# Patient Record
Sex: Female | Born: 1990 | Race: Black or African American | Hispanic: No | Marital: Single | Smoking: Never smoker
Health system: Southern US, Community
[De-identification: ages and names within clinical notes are randomized; demographics above are authoritative.]

## PROBLEM LIST (undated history)

## (undated) DIAGNOSIS — S0990XA Unspecified injury of head, initial encounter: Secondary | ICD-10-CM

---
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
## 2011-02-09 ENCOUNTER — Inpatient Hospital Stay (INDEPENDENT_AMBULATORY_CARE_PROVIDER_SITE_OTHER)
Admission: RE | Admit: 2011-02-09 | Discharge: 2011-02-09 | Disposition: A | Discharge status: Home / Self Care | Payer: Medicaid Other | Source: Ambulatory Visit | Attending: Family Medicine | Admitting: Family Medicine

## 2011-02-09 DIAGNOSIS — B356 Tinea cruris: Secondary | ICD-10-CM

## 2011-02-09 DIAGNOSIS — L259 Unspecified contact dermatitis, unspecified cause: Secondary | ICD-10-CM

## 2011-03-12 ENCOUNTER — Emergency Department (HOSPITAL_COMMUNITY): Payer: Medicaid Other

## 2011-03-12 ENCOUNTER — Emergency Department (HOSPITAL_COMMUNITY)
Admission: EM | Admit: 2011-03-12 | Discharge: 2011-03-12 | Disposition: A | Discharge status: Home / Self Care | Payer: Medicaid Other | Attending: Emergency Medicine | Admitting: Emergency Medicine

## 2011-03-12 DIAGNOSIS — F410 Panic disorder [episodic paroxysmal anxiety] without agoraphobia: Secondary | ICD-10-CM | POA: Insufficient documentation

## 2011-03-12 DIAGNOSIS — R51 Headache: Secondary | ICD-10-CM | POA: Insufficient documentation

## 2011-03-12 DIAGNOSIS — R0602 Shortness of breath: Secondary | ICD-10-CM | POA: Insufficient documentation

## 2011-03-12 DIAGNOSIS — R079 Chest pain, unspecified: Secondary | ICD-10-CM | POA: Insufficient documentation

## 2015-06-06 ENCOUNTER — Emergency Department (HOSPITAL_COMMUNITY)
Admission: EM | Admit: 2015-06-06 | Discharge: 2015-06-06 | Disposition: A | Discharge status: Home / Self Care | Payer: Medicaid Other | Attending: Emergency Medicine | Admitting: Emergency Medicine

## 2015-06-06 ENCOUNTER — Emergency Department (HOSPITAL_COMMUNITY): Payer: Medicaid Other

## 2015-06-06 ENCOUNTER — Encounter (HOSPITAL_COMMUNITY): Payer: Self-pay

## 2015-06-06 DIAGNOSIS — Y9389 Activity, other specified: Secondary | ICD-10-CM | POA: Insufficient documentation

## 2015-06-06 DIAGNOSIS — S0990XA Unspecified injury of head, initial encounter: Secondary | ICD-10-CM

## 2015-06-06 DIAGNOSIS — S00212A Abrasion of left eyelid and periocular area, initial encounter: Secondary | ICD-10-CM | POA: Insufficient documentation

## 2015-06-06 DIAGNOSIS — Z792 Long term (current) use of antibiotics: Secondary | ICD-10-CM | POA: Insufficient documentation

## 2015-06-06 DIAGNOSIS — Y998 Other external cause status: Secondary | ICD-10-CM | POA: Insufficient documentation

## 2015-06-06 DIAGNOSIS — Y9289 Other specified places as the place of occurrence of the external cause: Secondary | ICD-10-CM | POA: Insufficient documentation

## 2015-06-06 DIAGNOSIS — S80211A Abrasion, right knee, initial encounter: Secondary | ICD-10-CM | POA: Insufficient documentation

## 2015-06-06 DIAGNOSIS — Z79899 Other long term (current) drug therapy: Secondary | ICD-10-CM | POA: Insufficient documentation

## 2015-06-06 DIAGNOSIS — S0012XA Contusion of left eyelid and periocular area, initial encounter: Secondary | ICD-10-CM | POA: Insufficient documentation

## 2015-06-06 DIAGNOSIS — S0083XA Contusion of other part of head, initial encounter: Secondary | ICD-10-CM | POA: Insufficient documentation

## 2015-06-06 DIAGNOSIS — S29001A Unspecified injury of muscle and tendon of front wall of thorax, initial encounter: Secondary | ICD-10-CM | POA: Insufficient documentation

## 2015-06-06 HISTORY — DX: Unspecified injury of head, initial encounter: S09.90XA

## 2015-06-06 LAB — I-STAT BETA HCG BLOOD, ED (MC, WL, AP ONLY)

## 2015-06-06 MED ORDER — ONDANSETRON 4 MG PO TBDP
4.0000 mg | ORAL_TABLET | Freq: Once | ORAL | Status: AC
  Administered 2015-06-06: 4 mg via ORAL
  Filled 2015-06-06: qty 1

## 2015-06-06 MED ORDER — HYDROCODONE-ACETAMINOPHEN 5-325 MG PO TABS
1.0000 | ORAL_TABLET | Freq: Once | ORAL | Status: AC
  Administered 2015-06-06: 1 via ORAL
  Filled 2015-06-06: qty 1

## 2015-06-06 MED ORDER — ONDANSETRON 4 MG PO TBDP: 4.0000 mg | ORAL_TABLET | Freq: Three times a day (TID) | ORAL | Status: AC | PRN

## 2015-06-06 MED ORDER — HYDROCODONE-ACETAMINOPHEN 5-325 MG PO TABS: 2.0000 | ORAL_TABLET | ORAL | Status: AC | PRN
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
## 2015-06-06 NOTE — Discharge Instructions (Signed)
Concussion, Adult A concussion, or closed-head injury, is a brain injury caused by a direct blow to the head or by a quick and sudden movement (jolt) of the head or neck. Concussions are usually not life-threatening. Even so, the effects of a concussion can be serious. If you have had a concussion before, you are more likely to experience concussion-like symptoms after a direct blow to the head.  CAUSES  Direct blow to the head, such as from running into another player during a soccer game, being hit in a fight, or hitting your head on a hard surface.  A jolt of the head or neck that causes the brain to move back and forth inside the skull, such as in a car crash. SIGNS AND SYMPTOMS The signs of a concussion can be hard to notice. Early on, they may be missed by you, family members, and health care providers. You may look fine but act or feel differently. Symptoms are usually temporary, but they may last for days, weeks, or even longer. Some symptoms may appear right away while others may not show up for hours or days. Every head injury is different. Symptoms include:  Mild to moderate headaches that will not go away.  A feeling of pressure inside your head.  Having more trouble than usual:  Learning or remembering things you have heard.  Answering questions.  Paying attention or concentrating.  Organizing daily tasks.  Making decisions and solving problems.  Slowness in thinking, acting or reacting, speaking, or reading.  Getting lost or being easily confused.  Feeling tired all the time or lacking energy (fatigued).  Feeling drowsy.  Sleep disturbances.  Sleeping more than usual.  Sleeping less than usual.  Trouble falling asleep.  Trouble sleeping (insomnia).  Loss of balance or feeling lightheaded or dizzy.  Nausea or vomiting.  Numbness or tingling.  Increased sensitivity to:  Sounds.  Lights.  Distractions.  Vision problems or eyes that tire  easily.  Diminished sense of taste or smell.  Ringing in the ears.  Mood changes such as feeling sad or anxious.  Becoming easily irritated or angry for little or no reason.  Lack of motivation.  Seeing or hearing things other people do not see or hear (hallucinations). DIAGNOSIS Your health care provider can usually diagnose a concussion based on a description of your injury and symptoms. He or she will ask whether you passed out (lost consciousness) and whether you are having trouble remembering events that happened right before and during your injury. Your evaluation might include:  A brain scan to look for signs of injury to the brain. Even if the test shows no injury, you may still have a concussion.  Blood tests to be sure other problems are not present. TREATMENT  Concussions are usually treated in an emergency department, in urgent care, or at a clinic. You may need to stay in the hospital overnight for further treatment.  Tell your health care provider if you are taking any medicines, including prescription medicines, over-the-counter medicines, and natural remedies. Some medicines, such as blood thinners (anticoagulants) and aspirin, may increase the chance of complications. Also tell your health care provider whether you have had alcohol or are taking illegal drugs. This information may affect treatment.  Your health care provider will send you home with important instructions to follow.  How fast you will recover from a concussion depends on many factors. These factors include how severe your concussion is, what part of your brain was injured,  your age, and how healthy you were before the concussion. °· Most people with mild injuries recover fully. Recovery can take time. In general, recovery is slower in older persons. Also, persons who have had a concussion in the past or have other medical problems may find that it takes longer to recover from their current injury. °HOME  CARE INSTRUCTIONS °General Instructions °· Carefully follow the directions your health care provider gave you. °· Only take over-the-counter or prescription medicines for pain, discomfort, or fever as directed by your health care provider. °· Take only those medicines that your health care provider has approved. °· Do not drink alcohol until your health care provider says you are well enough to do so. Alcohol and certain other drugs may slow your recovery and can put you at risk of further injury. °· If it is harder than usual to remember things, write them down. °· If you are easily distracted, try to do one thing at a time. For example, do not try to watch TV while fixing dinner. °· Talk with family members or close friends when making important decisions. °· Keep all follow-up appointments. Repeated evaluation of your symptoms is recommended for your recovery. °· Watch your symptoms and tell others to do the same. Complications sometimes occur after a concussion. Older adults with a brain injury may have a higher risk of serious complications, such as a blood clot on the brain. °· Tell your teachers, school nurse, school counselor, coach, athletic trainer, or work manager about your injury, symptoms, and restrictions. Tell them about what you can or cannot do. They should watch for: °¨ Increased problems with attention or concentration. °¨ Increased difficulty remembering or learning new information. °¨ Increased time needed to complete tasks or assignments. °¨ Increased irritability or decreased ability to cope with stress. °¨ Increased symptoms. °· Rest. Rest helps the brain to heal. Make sure you: °¨ Get plenty of sleep at night. Avoid staying up late at night. °¨ Keep the same bedtime hours on weekends and weekdays. °¨ Rest during the day. Take daytime naps or rest breaks when you feel tired. °· Limit activities that require a lot of thought or concentration. These include: °¨ Doing homework or job-related  work. °¨ Watching TV. °¨ Working on the computer. °· Avoid any situation where there is potential for another head injury (football, hockey, soccer, basketball, martial arts, downhill snow sports and horseback riding). Your condition will get worse every time you experience a concussion. You should avoid these activities until you are evaluated by the appropriate follow-up health care providers. °Returning To Your Regular Activities °You will need to return to your normal activities slowly, not all at once. You must give your body and brain enough time for recovery. °· Do not return to sports or other athletic activities until your health care provider tells you it is safe to do so. °· Ask your health care provider when you can drive, ride a bicycle, or operate heavy machinery. Your ability to react may be slower after a brain injury. Never do these activities if you are dizzy. °· Ask your health care provider about when you can return to work or school. °Preventing Another Concussion °It is very important to avoid another brain injury, especially before you have recovered. In rare cases, another injury can lead to permanent brain damage, brain swelling, or death. The risk of this is greatest during the first 7-10 days after a head injury. Avoid injuries by: °· Wearing a   seat belt when riding in a car.  Drinking alcohol only in moderation.  Wearing a helmet when biking, skiing, skateboarding, skating, or doing similar activities.  Avoiding activities that could lead to a second concussion, such as contact or recreational sports, until your health care provider says it is okay.  Taking safety measures in your home.  Remove clutter and tripping hazards from floors and stairways.  Use grab bars in bathrooms and handrails by stairs.  Place non-slip mats on floors and in bathtubs.  Improve lighting in dim areas. SEEK MEDICAL CARE IF:  You have increased problems paying attention or  concentrating.  You have increased difficulty remembering or learning new information.  You need more time to complete tasks or assignments than before.  You have increased irritability or decreased ability to cope with stress.  You have more symptoms than before. Seek medical care if you have any of the following symptoms for more than 2 weeks after your injury:  Lasting (chronic) headaches.  Dizziness or balance problems.  Nausea.  Vision problems.  Increased sensitivity to noise or light.  Depression or mood swings.  Anxiety or irritability.  Memory problems.  Difficulty concentrating or paying attention.  Sleep problems.  Feeling tired all the time. SEEK IMMEDIATE MEDICAL CARE IF:  You have severe or worsening headaches. These may be a sign of a blood clot in the brain.  You have weakness (even if only in one hand, leg, or part of the face).  You have numbness.  You have decreased coordination.  You vomit repeatedly.  You have increased sleepiness.  One pupil is larger than the other.  You have convulsions.  You have slurred speech.  You have increased confusion. This may be a sign of a blood clot in the brain.  You have increased restlessness, agitation, or irritability.  You are unable to recognize people or places.  You have neck pain.  It is difficult to wake you up.  You have unusual behavior changes.  You lose consciousness. MAKE SURE YOU:  Understand these instructions.  Will watch your condition.  Will get help right away if you are not doing well or get worse.   This information is not intended to replace advice given to you by your health care provider. Make sure you discuss any questions you have with your health care provider.   Document Released: 07/30/2003 Document Revised: 05/30/2014 Document Reviewed: 11/29/2012 Elsevier Interactive Patient Education 2016 [employer]  Facial or Scalp Contusion  A facial or scalp  contusion is a deep bruise on the face or head. Contusions happen when an injury causes bleeding under the skin. Signs of bruising include pain, puffiness (swelling), and discolored skin. The contusion may turn blue, purple, or yellow. HOME CARE  Only take medicines as told by your doctor.  Put ice on the injured area.  Put ice in a plastic bag.  Place a towel between your skin and the bag.  Leave the ice on for 20 minutes, 2-3 times a day. GET HELP IF:  You have bite problems.  You have pain when chewing.  You are worried about your face not healing normally. GET HELP RIGHT AWAY IF:   You have severe pain or a headache and medicine does not help.  You are very tired or confused, or your personality changes.  You throw up (vomit).  You have a nosebleed that will not stop.  You see two of everything (double vision) or have blurry vision.  You have fluid coming from your nose or ear.  You have problems walking or using your arms or legs. MAKE SURE YOU:   Understand these instructions.  Will watch your condition.  Will get help right away if you are not doing well or get worse.   This information is not intended to replace advice given to you by your health care provider. Make sure you discuss any questions you have with your health care provider.   Document Released: 04/28/2011 Document Revised: 05/30/2014 Document Reviewed: 12/20/2012 Elsevier Interactive Patient Education [employer]
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
